# Patient Record
Sex: Female | Born: 2000 | Hispanic: Yes | Marital: Single | Smoking: Never smoker
Health system: Southern US, Community
[De-identification: ages and names within clinical notes are randomized; demographics above are authoritative.]

---
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
## 2020-10-02 ENCOUNTER — Ambulatory Visit (INDEPENDENT_AMBULATORY_CARE_PROVIDER_SITE_OTHER): Payer: Medicaid Other

## 2020-10-02 ENCOUNTER — Ambulatory Visit (HOSPITAL_COMMUNITY)
Admission: EM | Admit: 2020-10-02 | Discharge: 2020-10-02 | Disposition: A | Discharge status: Home / Self Care | Payer: Medicaid Other

## 2020-10-02 ENCOUNTER — Other Ambulatory Visit: Payer: Self-pay

## 2020-10-02 ENCOUNTER — Encounter (HOSPITAL_COMMUNITY): Payer: Self-pay | Admitting: *Deleted

## 2020-10-02 DIAGNOSIS — S6701XA Crushing injury of right thumb, initial encounter: Secondary | ICD-10-CM | POA: Diagnosis not present

## 2020-10-02 DIAGNOSIS — R2231 Localized swelling, mass and lump, right upper limb: Secondary | ICD-10-CM

## 2020-10-02 DIAGNOSIS — S6010XA Contusion of unspecified finger with damage to nail, initial encounter: Secondary | ICD-10-CM

## 2020-10-02 DIAGNOSIS — M79644 Pain in right finger(s): Secondary | ICD-10-CM

## 2020-10-02 DIAGNOSIS — S60111A Contusion of right thumb with damage to nail, initial encounter: Secondary | ICD-10-CM

## 2020-10-02 MED ORDER — HYDROCODONE-ACETAMINOPHEN 5-325 MG PO TABS: 1.0000 | ORAL_TABLET | Freq: Three times a day (TID) | ORAL | 0 refills | Status: DC | PRN

## 2020-10-02 NOTE — ED Triage Notes (Signed)
Reports shutting right thumb in car door 2 days ago.  C/O continued pain.  Hematoma noted to right thumb nailbed; distal aspect warm with prompt cap refill.

## 2020-10-02 NOTE — Discharge Instructions (Signed)
Take medication as prescribed Return if no improvement Apply ice to thumb 15 minutes 4 times per day.

## 2020-10-02 NOTE — ED Provider Notes (Signed)
[facility]    CSN: [id number] Arrival date & time: 10/02/20  1619      History   Chief Complaint Chief Complaint  Patient presents with   Finger Injury    HPI Erica Barnes is a 19 y.o. female.   Patient here c/w R thumb pain x 2 days.  She accidentally slammed thumb in car door.  Admits pain, tenderness, swelling, bleeding under thumbnail.  Denies n/t, RROM, weakness.  Pain 8/10 today.     History reviewed. No pertinent past medical history.  There are no problems to display for this patient.   History reviewed. No pertinent surgical history.  OB History   No obstetric history on file.      Home Medications    Prior to Admission medications   Medication Sig Start Date End Date Taking? Authorizing Provider  HYDROcodone-acetaminophen (NORCO/VICODIN) 5-325 MG tablet Take 1 tablet by mouth every 8 (eight) hours as needed for severe pain. 10/02/20   Evern Core, PA-C  Ibuprofen (ADVIL PO) Take by mouth.    [provider]    Family History Family History  Problem Relation Age of Onset   Healthy Mother    Healthy Father     Social History Social History   Tobacco Use   Smoking status: Never Smoker   Smokeless tobacco: Never Used  Building services engineer Use: Never used  Substance Use Topics   Alcohol use: Not Currently   Drug use: Never     Allergies   Patient has no known allergies.   Review of Systems Review of Systems  Constitutional: Negative for chills, fatigue and fever.  Gastrointestinal: Negative for nausea and vomiting.  Musculoskeletal: Positive for myalgias. Negative for arthralgias, gait problem and joint swelling.  Skin: Positive for color change. Negative for wound.  Hematological: Negative for adenopathy. Does not bruise/bleed easily.  Psychiatric/Behavioral: Positive for sleep disturbance.     Physical Exam Triage Vital Signs ED Triage Vitals [10/02/20 1737]  Enc Vitals Group     BP  127/75     Pulse Rate 75     Resp 16     Temp 98.1 F (36.7 C)     Temp Source Oral     SpO2 100 %     Weight      Height      Head Circumference      Peak Flow      Pain Score 8     Pain Loc      Pain Edu?      Excl. in GC?    No data found.  Updated Vital Signs BP 127/75    Pulse 75    Temp 98.1 F (36.7 C) (Oral)    Resp 16    LMP 09/28/2020 (Exact Date)    SpO2 100%   Visual Acuity Right Eye Distance:   Left Eye Distance:   Bilateral Distance:    Right Eye Near:   Left Eye Near:    Bilateral Near:     Physical Exam Vitals and nursing note reviewed.  Constitutional:      General: She is not in acute distress.    Appearance: Normal appearance. She is not ill-appearing.  HENT:     Head: Normocephalic and atraumatic.  Eyes:     General: No scleral icterus.    Extraocular Movements: Extraocular movements intact.     Conjunctiva/sclera: Conjunctivae normal.  Pulmonary:     Effort: Pulmonary effort is normal. No respiratory  distress.  Musculoskeletal:     Right hand: Swelling and tenderness present. No deformity, lacerations or bony tenderness. Decreased range of motion. Normal strength. Normal pulse.     Cervical back: Normal range of motion. No rigidity.     Comments: subungual hematoma noted R thumbnail  Skin:    Capillary Refill: Capillary refill takes less than 2 seconds.     Coloration: Skin is not jaundiced.     Findings: No rash.  Neurological:     General: No focal deficit present.     Mental Status: She is alert and oriented to person, place, and time.     Motor: No weakness.     Gait: Gait normal.  Psychiatric:        Mood and Affect: Mood normal.        Behavior: Behavior normal.      UC Treatments / Results  Labs (all labs ordered are listed, but only abnormal results are displayed) Labs Reviewed - No data to display  EKG   Radiology DG Finger Thumb Right  Result Date: 10/02/2020 CLINICAL DATA:  Crush injury with pain, swelling,  and decreased range of motion. EXAM: RIGHT THUMB 2+V COMPARISON:  None. FINDINGS: There is no evidence of fracture or dislocation. There is no evidence of arthropathy or other focal bone abnormality. Soft tissue edema. No soft tissue air. IMPRESSION: Soft tissue edema without acute osseous abnormality. Electronically Signed   By: Narda Rutherford M.D.   On: 10/02/2020 18:09    Procedures Incision and Drainage  Date/Time: 10/02/2020 7:27 PM Performed by: Evern Core, PA-C Authorized by: Evern Core, PA-C   Consent:    Consent obtained:  Verbal   Consent given by:  Patient   Risks discussed:  Bleeding   Alternatives discussed:  No treatment Universal protocol:    Patient identity confirmed:  Verbally with patient Location:    Type:  Subungual hematoma   Location:  Upper extremity   Upper extremity location:  Finger   Finger location:  R thumb Pre-procedure details:    Procedure prep: alcohol. Sedation:    Sedation type:  None Anesthesia:    Anesthesia method:  None Post-procedure details:    Procedure completion:  Tolerated well, no immediate complications Comments:     Advised patient prior to procedure that due to time since injury blood may be difficult to remove from under nail, patient verbalized understanding and wishes to proceed with the procedure.  18 gauge needle, scant amount of blood exsanguinated through nail   (including critical care time)  Medications Ordered in UC Medications - No data to display  Initial Impression / Assessment and Plan / UC Course  I have reviewed the triage vital signs and the nursing notes.  Pertinent labs & imaging results that were available during my care of the patient were reviewed by me and considered in my medical decision making (see chart for details).     Take narcotic pain medication when pain is severe Apply ice to thumb Return with new or worsening symptoms.  Final Clinical Impressions(s) / UC Diagnoses    Final diagnoses:  Subungual hematoma of digit of hand, initial encounter  Contusion of right thumb with damage to nail, initial encounter     Discharge Instructions     Take medication as prescribed Return if no improvement Apply ice to thumb 15 minutes 4 times per day.    ED Prescriptions    Medication Sig Dispense Auth. Provider   HYDROcodone-acetaminophen (NORCO/VICODIN) 5-325  MG tablet Take 1 tablet by mouth every 8 (eight) hours as needed for severe pain. 6 tablet Evern Core, PA-C     I have reviewed the PDMP during this encounter.   Evern Core, PA-C 10/02/20 1930

## 2021-04-14 IMAGING — DX DG FINGER THUMB 2+V*R*
3 series · 3 of 3 positions shown · non-contrast
Comparison: None.

CLINICAL DATA: Crush injury with pain, swelling, and decreased
range of motion.

EXAM:
RIGHT THUMB 2+V

[finger ap]
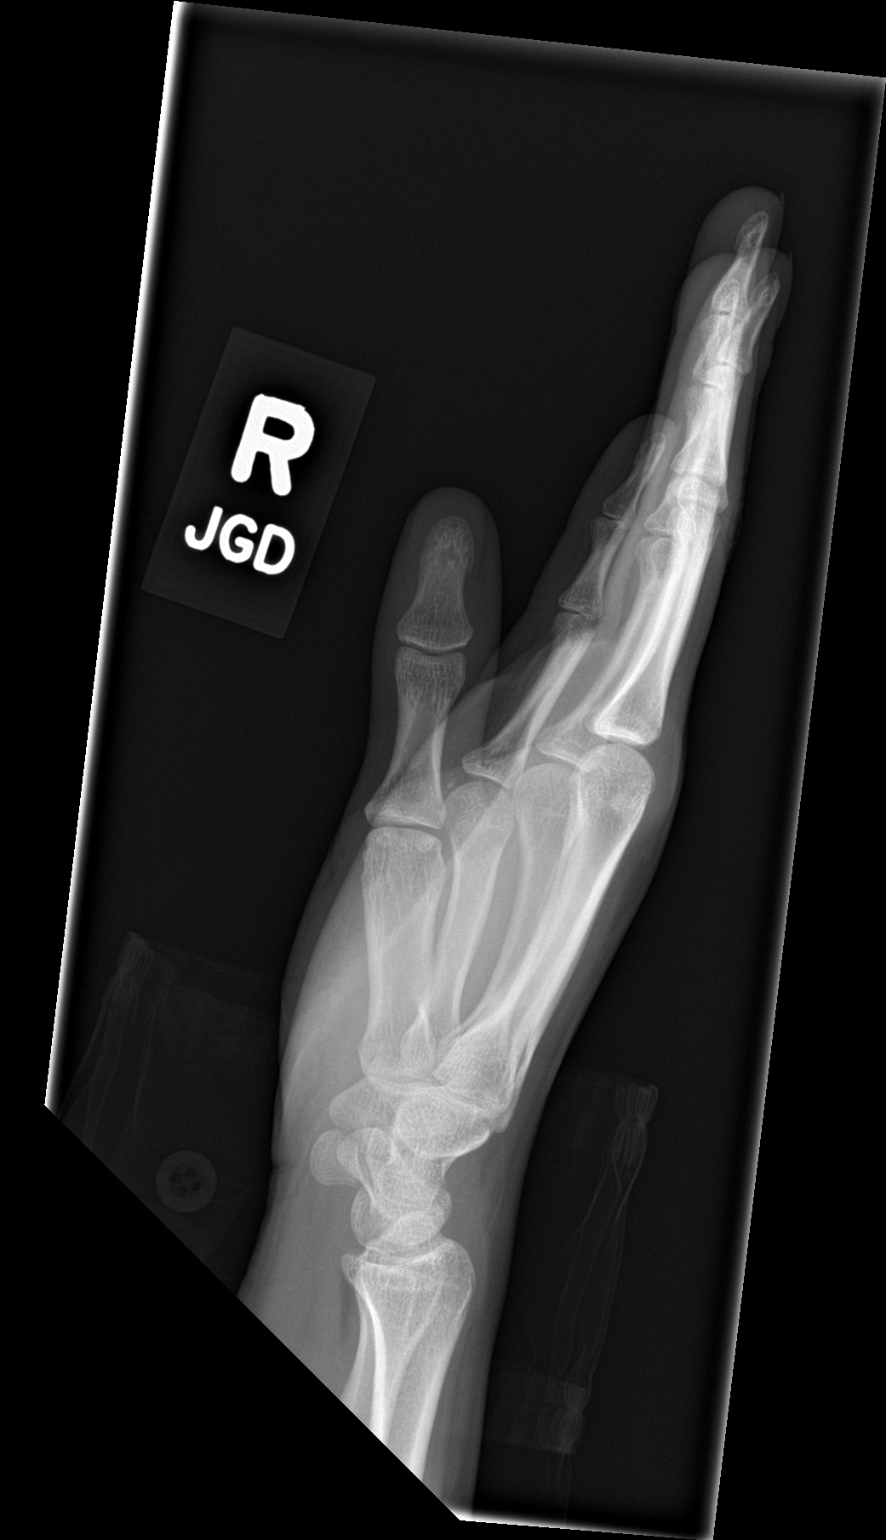

[finger obl]
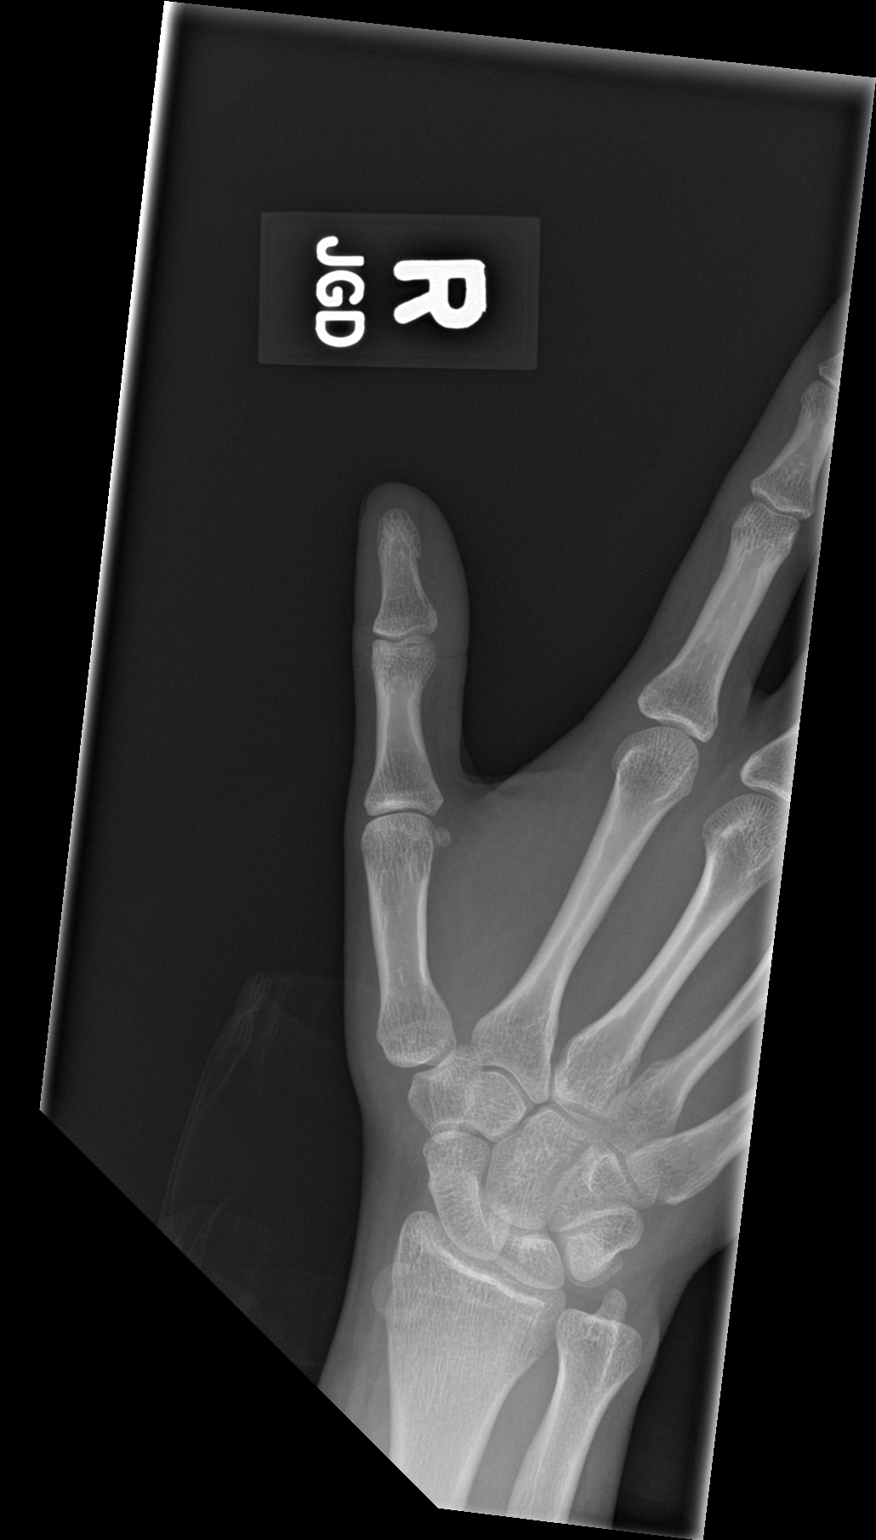

[finger lat]
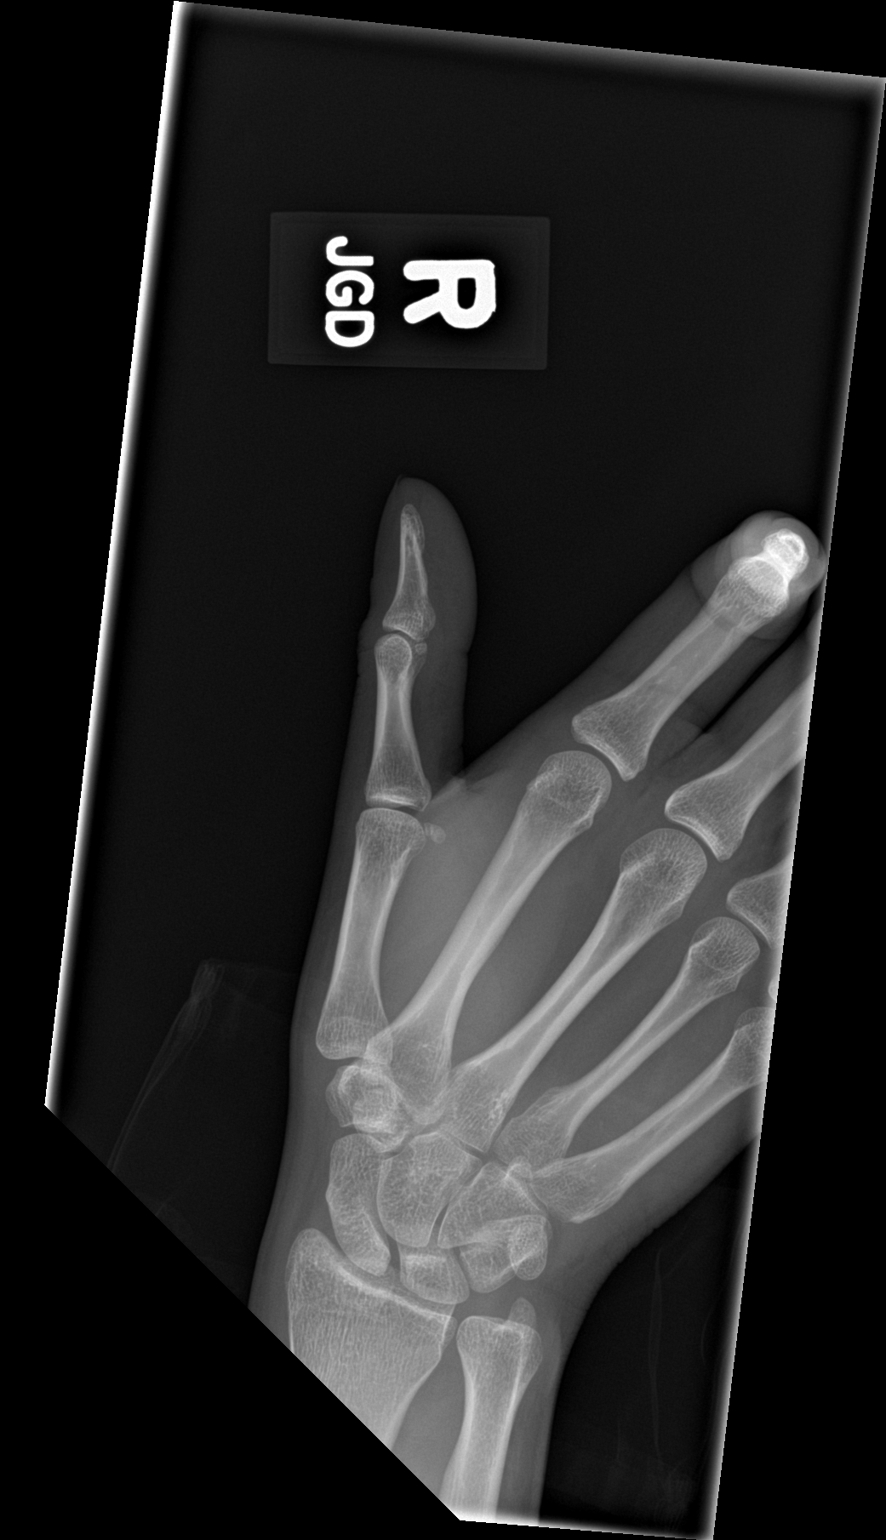

[3 of 3 positions shown; findings below may reference images not displayed]

FINDINGS: There is no evidence of fracture or dislocation. There is no
evidence of arthropathy or other focal bone abnormality. Soft tissue
edema. No soft tissue air.
IMPRESSION: Soft tissue edema without acute osseous abnormality.

## 2024-11-27 ENCOUNTER — Encounter: Payer: Self-pay | Admitting: Nurse Practitioner

## 2024-11-27 ENCOUNTER — Ambulatory Visit: Payer: Self-pay | Admitting: Nurse Practitioner

## 2024-11-27 VITALS — BP 104/62 | HR 80 | Temp 97.9°F | Ht 61.5 in | Wt 129.8 lb

## 2024-11-27 DIAGNOSIS — F419 Anxiety disorder, unspecified: Secondary | ICD-10-CM | POA: Insufficient documentation

## 2024-11-27 DIAGNOSIS — Z113 Encounter for screening for infections with a predominantly sexual mode of transmission: Secondary | ICD-10-CM

## 2024-11-27 DIAGNOSIS — Z136 Encounter for screening for cardiovascular disorders: Secondary | ICD-10-CM

## 2024-11-27 DIAGNOSIS — L659 Nonscarring hair loss, unspecified: Secondary | ICD-10-CM

## 2024-11-27 DIAGNOSIS — R5383 Other fatigue: Secondary | ICD-10-CM

## 2024-11-27 MED ORDER — SERTRALINE HCL 25 MG PO TABS: 25.0000 mg | ORAL_TABLET | Freq: Every day | ORAL | 1 refills | Status: AC

## 2024-11-27 NOTE — Progress Notes (Signed)
 "  New Patient Visit  BP 104/62 (BP Location: Right Arm, Patient Position: Sitting, Cuff Size: Normal)   Pulse 80   Temp 97.9 F (36.6 C)   Ht 5' 1.5 (1.562 m)   Wt 129 lb 12.8 oz (58.9 kg)   LMP 11/17/2024 (Exact Date)   SpO2 99%   BMI 24.13 kg/m    Subjective:    Patient ID: Erica Barnes, female    DOB: Dec 15, 2000, 24 y.o.   MRN: [id number]  CC: Chief Complaint  Patient presents with   Establish Care    NP. Est. Care, concerns with hot flashes, irritable, hair loss, mood swings, menstrual cramps, eyes twitching when stressed    HPI: Erica Barnes is a 24 y.o. female presents for new patient visit to establish care. Introduced to publishing rights manager role and practice setting. All questions answered. Discussed provider/patient relationship and expectations.  Discussed the use of AI scribe software for clinical note transcription with the patient, who gave verbal consent to proceed.  She has had heat flashes mainly intermittently for the last 1-2 months, with alternating chills, increased thirst, and marked appetite loss. She has unintentionally lost about 10 pounds over this period.  She has worsening anxiety with episodes of feeling unlike herself. Hydroxyzine caused side effects in the past and has not tried other anxiety medications. Sharp arm pains and eye twitching occur with anxiety and stress.   Her menses are now irregular, coming at both the beginning and end of the month, with heavier flow and worse cramps.  She has had hair loss over the past year with a spot on the back of her head that is regrowing. Her scalp and body are itchy without visible rash.  She has fatigue despite adequate sleep, occasional orthostatic dizziness, and prior frequent headaches that have improved in the past couple of weeks. She denies SI/HI.        11/27/2024    3:09 PM  Depression screen PHQ 2/9  Decreased Interest 1  Down, Depressed, Hopeless 1  PHQ - 2 Score 2  Altered  sleeping 0  Tired, decreased energy 2  Change in appetite 1  Feeling bad or failure about yourself  0  Trouble concentrating 1  Moving slowly or fidgety/restless 0  Suicidal thoughts 0  PHQ-9 Score 6  Difficult doing work/chores Somewhat difficult      11/27/2024    3:10 PM  GAD 7 : Generalized Anxiety Score  Nervous, Anxious, on Edge 1  Control/stop worrying 1  Worry too much - different things 1  Trouble relaxing 1  Restless 0  Easily annoyed or irritable 1  Afraid - awful might happen 0  Total GAD 7 Score 5  Anxiety Difficulty Somewhat difficult    History reviewed. No pertinent past medical history.  History reviewed. No pertinent surgical history.  Family History  Problem Relation Age of Onset   Healthy Mother    Diabetes Father      Social History[1]  Medications Ordered Prior to Encounter[2]   Review of Systems  Constitutional:  Positive for fatigue. Negative for fever.  HENT: Negative.    Respiratory: Negative.    Cardiovascular: Negative.   Gastrointestinal:  Negative for abdominal pain, diarrhea, nausea and vomiting.       Decreased appetite   Endocrine: Positive for heat intolerance and polydipsia. Negative for polyphagia and polyuria.  Genitourinary:  Positive for menstrual problem (cramping, more frequent). Negative for dysuria and frequency.  Musculoskeletal:  Positive for arthralgias (knees at  times).  Skin: Negative.   Neurological: Negative.   Psychiatric/Behavioral:  Positive for dysphoric mood. The patient is nervous/anxious.        Objective:    BP 104/62 (BP Location: Right Arm, Patient Position: Sitting, Cuff Size: Normal)   Pulse 80   Temp 97.9 F (36.6 C)   Ht 5' 1.5 (1.562 m)   Wt 129 lb 12.8 oz (58.9 kg)   LMP 11/17/2024 (Exact Date)   SpO2 99%   BMI 24.13 kg/m   Wt Readings from Last 3 Encounters:  11/27/24 129 lb 12.8 oz (58.9 kg)    BP Readings from Last 3 Encounters:  11/27/24 104/62  10/02/20 127/75    Physical  Exam Vitals and nursing note reviewed.  Constitutional:      General: She is not in acute distress.    Appearance: Normal appearance.  HENT:     Head: Normocephalic and atraumatic.     Right Ear: Tympanic membrane, ear canal and external ear normal.     Left Ear: Tympanic membrane, ear canal and external ear normal.  Eyes:     Conjunctiva/sclera: Conjunctivae normal.  Cardiovascular:     Rate and Rhythm: Normal rate and regular rhythm.     Pulses: Normal pulses.     Heart sounds: Normal heart sounds.  Pulmonary:     Effort: Pulmonary effort is normal.     Breath sounds: Normal breath sounds.  Abdominal:     Palpations: Abdomen is soft.     Tenderness: There is no abdominal tenderness.  Musculoskeletal:        General: Normal range of motion.     Cervical back: Normal range of motion and neck supple.     Right lower leg: No edema.     Left lower leg: No edema.  Lymphadenopathy:     Cervical: No cervical adenopathy.  Skin:    General: Skin is warm and dry.     Findings: No rash.  Neurological:     General: No focal deficit present.     Mental Status: She is alert and oriented to person, place, and time.     Cranial Nerves: No cranial nerve deficit.     Coordination: Coordination normal.     Gait: Gait normal.  Psychiatric:        Mood and Affect: Mood normal.        Behavior: Behavior normal.        Thought Content: Thought content normal.        Judgment: Judgment normal.        Assessment & Plan:   Problem List Items Addressed This Visit       Other   Anxiety and depression - Primary   Chronic, not controlled. She experiences increased anxiety and irritability. Hydroxyzine was discontinued due to adverse effects. She is interested in trying another medication. Prescribed sertraline  (Zoloft ) 25mg  once daily. Discussed potential side effects. Declined therapy at this time. Follow-up in 4-6 weeks.      Relevant Medications   sertraline  (ZOLOFT ) 25 MG tablet    Other Visit Diagnoses       Hair loss       No changes in skin noted. Check CMP, CBC, iron, ferritin, TSH, A1c, FSH, LH, testosterone today.   Relevant Orders   CBC with Differential/Platelet   Comprehensive metabolic panel with GFR   Hemoglobin A1c   Iron   Ferritin   Vitamin B12   VITAMIN D  25 Hydroxy (Vit-D Deficiency, Fractures)  TSH   FSH/LH   Testosterone     Fatigue, unspecified type       Check labs as noted above. Encourage regular sleep and exericse.   Relevant Orders   Hemoglobin A1c   Vitamin B12   VITAMIN D  25 Hydroxy (Vit-D Deficiency, Fractures)   TSH     Screen for STD (sexually transmitted disease)       Screen STD per request. No current symptoms   Relevant Orders   HIV Antibody (routine testing w rflx)   RPR W/RFLX TO RPR TITER, TREPONEMAL AB, SCREEN AND DIAGNOSIS   Hepatitis C antibody   Urine cytology ancillary only     Screening for cardiovascular condition       Screen baseline lipid panel   Relevant Orders   Lipid panel        Follow up plan: Return in about 4 weeks (around 12/25/2024) for 4-6 weeks depression and anxiety .  Tinnie DELENA Harada, NP  I,Emily Lagle,acting as a scribe for [employer], NP.,have documented all relevant documentation on the behalf of Lesly Joslyn DELENA Harada, NP.  I, Tinnie DELENA Harada, NP, have reviewed all documentation for this visit. The documentation on 11/27/2024 for the exam, diagnosis, procedures, and orders are all accurate and complete.     [1]  Social History Tobacco Use   Smoking status: Never    Passive exposure: Never   Smokeless tobacco: Never  Vaping Use   Vaping status: Never Used  Substance Use Topics   Alcohol use: Yes    Comment: socially   Drug use: Never  [2]  Current Outpatient Medications on File Prior to Visit  Medication Sig Dispense Refill   Ibuprofen (ADVIL PO) Take by mouth. (Patient not taking: Reported on 11/27/2024)     No current facility-administered medications on file prior  to visit.   "

## 2024-11-27 NOTE — Assessment & Plan Note (Signed)
 Chronic, not controlled. She experiences increased anxiety and irritability. Hydroxyzine was discontinued due to adverse effects. She is interested in trying another medication. Prescribed sertraline  (Zoloft ) 25mg  once daily. Discussed potential side effects. Declined therapy at this time. Follow-up in 4-6 weeks.

## 2024-11-27 NOTE — Patient Instructions (Signed)
 It was great to see you!  We are checking your labs today and will let you know the results via mychart/phone.   Start zoloft  1 tablet daily, in the morning or night, with or without food   Keep taking the skin and hair vitamin   Let's follow-up in 4-6 weeks, sooner if you have concerns.  If a referral was placed today, you will be contacted for an appointment. Please note that routine referrals can sometimes take up to 3-4 weeks to process. Please call our office if you haven't heard anything after this time frame.  Take care,  Tinnie Harada, NP

## 2024-11-28 ENCOUNTER — Ambulatory Visit: Payer: Self-pay | Admitting: Nurse Practitioner

## 2024-11-28 LAB — LIPID PANEL
Cholesterol: 121 mg/dL (ref 28–200)
HDL: 50.4 mg/dL
LDL Cholesterol: 59 mg/dL (ref 10–99)
NonHDL: 71.08
Total CHOL/HDL Ratio: 2
Triglycerides: 60 mg/dL (ref 10.0–149.0)
VLDL: 12 mg/dL (ref 0.0–40.0)

## 2024-11-28 LAB — CBC WITH DIFFERENTIAL/PLATELET
Basophils Absolute: 0 10*3/uL (ref 0.0–0.1)
Basophils Relative: 0.7 % (ref 0.0–3.0)
Eosinophils Absolute: 0.1 10*3/uL (ref 0.0–0.7)
Eosinophils Relative: 1.7 % (ref 0.0–5.0)
HCT: 41.9 % (ref 36.0–46.0)
Hemoglobin: 14.2 g/dL (ref 12.0–15.0)
Lymphocytes Relative: 34.2 % (ref 12.0–46.0)
Lymphs Abs: 2.4 10*3/uL (ref 0.7–4.0)
MCHC: 33.8 g/dL (ref 30.0–36.0)
MCV: 92.3 fl (ref 78.0–100.0)
Monocytes Absolute: 0.5 10*3/uL (ref 0.1–1.0)
Monocytes Relative: 7.7 % (ref 3.0–12.0)
Neutro Abs: 3.8 10*3/uL (ref 1.4–7.7)
Neutrophils Relative %: 55.7 % (ref 43.0–77.0)
Platelets: 184 10*3/uL (ref 150.0–400.0)
RBC: 4.54 Mil/uL (ref 3.87–5.11)
RDW: 12.7 % (ref 11.5–15.5)
WBC: 6.9 10*3/uL (ref 4.0–10.5)

## 2024-11-28 LAB — HIV ANTIBODY (ROUTINE TESTING W REFLEX)
HIV 1&2 Ab, 4th Generation: NONREACTIVE
HIV FINAL INTERPRETATION: NEGATIVE

## 2024-11-28 LAB — COMPREHENSIVE METABOLIC PANEL WITH GFR
ALT: 10 U/L (ref 3–35)
AST: 16 U/L (ref 5–37)
Albumin: 4.7 g/dL (ref 3.5–5.2)
Alkaline Phosphatase: 65 U/L (ref 39–117)
BUN: 10 mg/dL (ref 6–23)
CO2: 29 meq/L (ref 19–32)
Calcium: 9.4 mg/dL (ref 8.4–10.5)
Chloride: 103 meq/L (ref 96–112)
Creatinine, Ser: 0.7 mg/dL (ref 0.40–1.20)
GFR: 121.51 mL/min
Glucose, Bld: 81 mg/dL (ref 70–99)
Potassium: 3.9 meq/L (ref 3.5–5.1)
Sodium: 137 meq/L (ref 135–145)
Total Bilirubin: 0.4 mg/dL (ref 0.2–1.2)
Total Protein: 7.6 g/dL (ref 6.0–8.3)

## 2024-11-28 LAB — SYPHILIS: RPR W/REFLEX TO RPR TITER AND TREPONEMAL ANTIBODIES, TRADITIONAL SCREENING AND DIAGNOSIS ALGORITHM: RPR Ser Ql: NONREACTIVE

## 2024-11-28 LAB — FSH/LH
FSH: 6.9 m[IU]/mL
LH: 6 m[IU]/mL

## 2024-11-28 LAB — HEMOGLOBIN A1C: Hgb A1c MFr Bld: 5.3 % (ref 4.6–6.5)

## 2024-11-28 LAB — IRON: Iron: 99 ug/dL (ref 42–145)

## 2024-11-28 LAB — TESTOSTERONE: Testosterone: 54.65 ng/dL — ABNORMAL HIGH (ref 15.00–40.00)

## 2024-11-28 LAB — VITAMIN D 25 HYDROXY (VIT D DEFICIENCY, FRACTURES): VITD: 13.78 ng/mL — ABNORMAL LOW (ref 30.00–100.00)

## 2024-11-28 LAB — FERRITIN: Ferritin: 23.1 ng/mL (ref 10.0–291.0)

## 2024-11-28 LAB — HEPATITIS C ANTIBODY: Hepatitis C Ab: NONREACTIVE

## 2024-11-28 LAB — VITAMIN B12: Vitamin B-12: 294 pg/mL (ref 211–911)

## 2024-11-28 LAB — TSH: TSH: 0.58 u[IU]/mL (ref 0.35–5.50)

## 2024-11-28 MED ORDER — VITAMIN D (ERGOCALCIFEROL) 1.25 MG (50000 UNIT) PO CAPS: 50000.0000 [IU] | ORAL_CAPSULE | ORAL | 0 refills | Status: AC

## 2024-12-04 ENCOUNTER — Encounter: Payer: Self-pay | Admitting: Obstetrics and Gynecology

## 2024-12-25 ENCOUNTER — Ambulatory Visit: Admitting: Nurse Practitioner
